# Patient Record
Sex: Female | Born: 1990 | Race: White | Hispanic: No | Marital: Single | Smoking: Never smoker
Health system: Southern US, Community
[De-identification: ages and names within clinical notes are randomized; demographics above are authoritative.]

## PROBLEM LIST (undated history)

## (undated) HISTORY — PX: NO PAST SURGERIES: SHX2092

---

## 2011-10-16 ENCOUNTER — Emergency Department: Payer: Self-pay | Admitting: Emergency Medicine

## 2019-07-06 ENCOUNTER — Other Ambulatory Visit: Payer: Self-pay

## 2019-07-06 ENCOUNTER — Ambulatory Visit (INDEPENDENT_AMBULATORY_CARE_PROVIDER_SITE_OTHER): Payer: 59

## 2019-07-06 ENCOUNTER — Ambulatory Visit
Admission: EM | Admit: 2019-07-06 | Discharge: 2019-07-06 | Disposition: A | Discharge status: Home / Self Care | Payer: 59 | Attending: Family Medicine | Admitting: Family Medicine

## 2019-07-06 DIAGNOSIS — M25571 Pain in right ankle and joints of right foot: Secondary | ICD-10-CM | POA: Diagnosis not present

## 2019-07-06 DIAGNOSIS — W108XXA Fall (on) (from) other stairs and steps, initial encounter: Secondary | ICD-10-CM

## 2019-07-06 DIAGNOSIS — S93401A Sprain of unspecified ligament of right ankle, initial encounter: Secondary | ICD-10-CM | POA: Diagnosis not present

## 2019-07-06 NOTE — Discharge Instructions (Addendum)
Rest, ice, elevation, over the counter tylenol/ibuprofen as needed

## 2019-07-06 NOTE — ED Provider Notes (Signed)
[facility]    CSN: [id number] Arrival date & time: 07/06/19  1621      History   Chief Complaint Chief Complaint  Patient presents with  . Ankle Pain    right    HPI Ann Jones is a 28 y.o. female.   28 yo female with a c/o right ankle pain and swelling after falling down some steps last night and twisting her foot. States that she "heard a crack". Has been able to bear weight but painful.    Ankle Pain   History reviewed. No pertinent past medical history.  There are no active problems to display for this patient.   Past Surgical History:  Procedure Laterality Date  . NO PAST SURGERIES      OB History   No obstetric history on file.      Home Medications    Prior to Admission medications   Medication Sig Start Date End Date Taking? Authorizing Provider  sertraline (ZOLOFT) 50 MG tablet Take 50 mg by mouth daily. 05/28/19   [provider]  SPRINTEC 28 0.25-35 MG-MCG tablet Take 1 tablet by mouth daily. 05/28/19   [provider]    Family History Family History  Problem Relation Age of Onset  . Heart disease Mother     Social History Social History   Tobacco Use  . Smoking status: Never Smoker  . Smokeless tobacco: Never Used  Substance Use Topics  . Alcohol use: Yes    Frequency: Never    Comment: occasionally  . Drug use: Never     Allergies   Patient has no known allergies.   Review of Systems Review of Systems   Physical Exam Triage Vital Signs ED Triage Vitals  Enc Vitals Group     BP 07/06/19 1646 112/85     Pulse Rate 07/06/19 1646 70     Resp 07/06/19 1646 16     Temp 07/06/19 1646 98.9 F (37.2 C)     Temp Source 07/06/19 1646 Oral     SpO2 07/06/19 1646 100 %     Weight 07/06/19 1641 130 lb (59 kg)     Height 07/06/19 1641 5\' 2"  (1.575 m)     Head Circumference --      Peak Flow --      Pain Score 07/06/19 1641 3     Pain Loc --      Pain Edu? --      Excl. in GC? --    No  data found.  Updated Vital Signs BP 112/85 (BP Location: Left Arm)   Pulse 70   Temp 98.9 F (37.2 C) (Oral)   Resp 16   Ht 5\' 2"  (1.575 m)   Wt 59 kg   LMP 06/22/2019   SpO2 100%   BMI 23.78 kg/m   Visual Acuity Right Eye Distance:   Left Eye Distance:   Bilateral Distance:    Right Eye Near:   Left Eye Near:    Bilateral Near:     Physical Exam Vitals signs and nursing note reviewed.  Constitutional:      General: She is not in acute distress.    Appearance: She is not toxic-appearing or diaphoretic.  Musculoskeletal:     Right ankle: She exhibits decreased range of motion and swelling. She exhibits no ecchymosis and normal pulse. Tenderness. Lateral malleolus and AITFL tenderness found. No medial malleolus, no CF ligament, no posterior TFL, no head of 5th metatarsal and no  proximal fibula tenderness found. Achilles tendon normal.     Comments: Right foot neurovascularly intact  Neurological:     Mental Status: She is alert.      UC Treatments / Results  Labs (all labs ordered are listed, but only abnormal results are displayed) Labs Reviewed - No data to display  EKG   Radiology Dg Ankle Complete Right  Result Date: 07/06/2019 CLINICAL DATA:  Status post fall. EXAM: RIGHT ANKLE - COMPLETE 3+ VIEW COMPARISON:  None. FINDINGS: There is soft tissue swelling about the ankle, most notably about the lateral malleolus. There is no acute displaced fracture or dislocation. The mortise joint is well preserved. IMPRESSION: Negative. Electronically Signed   By: Constance Holster M.D.   On: 07/06/2019 17:14    Procedures Procedures (including critical care time)  Medications Ordered in UC Medications - No data to display  Initial Impression / Assessment and Plan / UC Course  I have reviewed the triage vital signs and the nursing notes.  Pertinent labs & imaging results that were available during my care of the patient were reviewed by me and considered in my  medical decision making (see chart for details).      Final Clinical Impressions(s) / UC Diagnoses   Final diagnoses:  Sprain of right ankle, unspecified ligament, initial encounter     Discharge Instructions     Rest, ice, elevation, over the counter tylenol/ibuprofen as needed    ED Prescriptions    None      1. x-ray results and diagnosis reviewed with patient 2. Ankle brace applied 3. Recommend supportive treatment as above  4. Follow-up prn if symptoms worsen or don't improve  PDMP not reviewed this encounter.   Norval Gable, MD 07/06/19 1739

## 2019-07-06 NOTE — ED Triage Notes (Signed)
Patient complains of a fall down her steps last night. Patient with pain and swelling on ankle. Reports that she did hear a crack when she fell.

## 2020-09-25 IMAGING — CR DG ANKLE COMPLETE 3+V*R*
3 series · 3 of 3 positions shown · non-contrast
Comparison: None.

CLINICAL DATA: Status post fall.

EXAM:
RIGHT ANKLE - COMPLETE 3+ VIEW

[ankle ap]
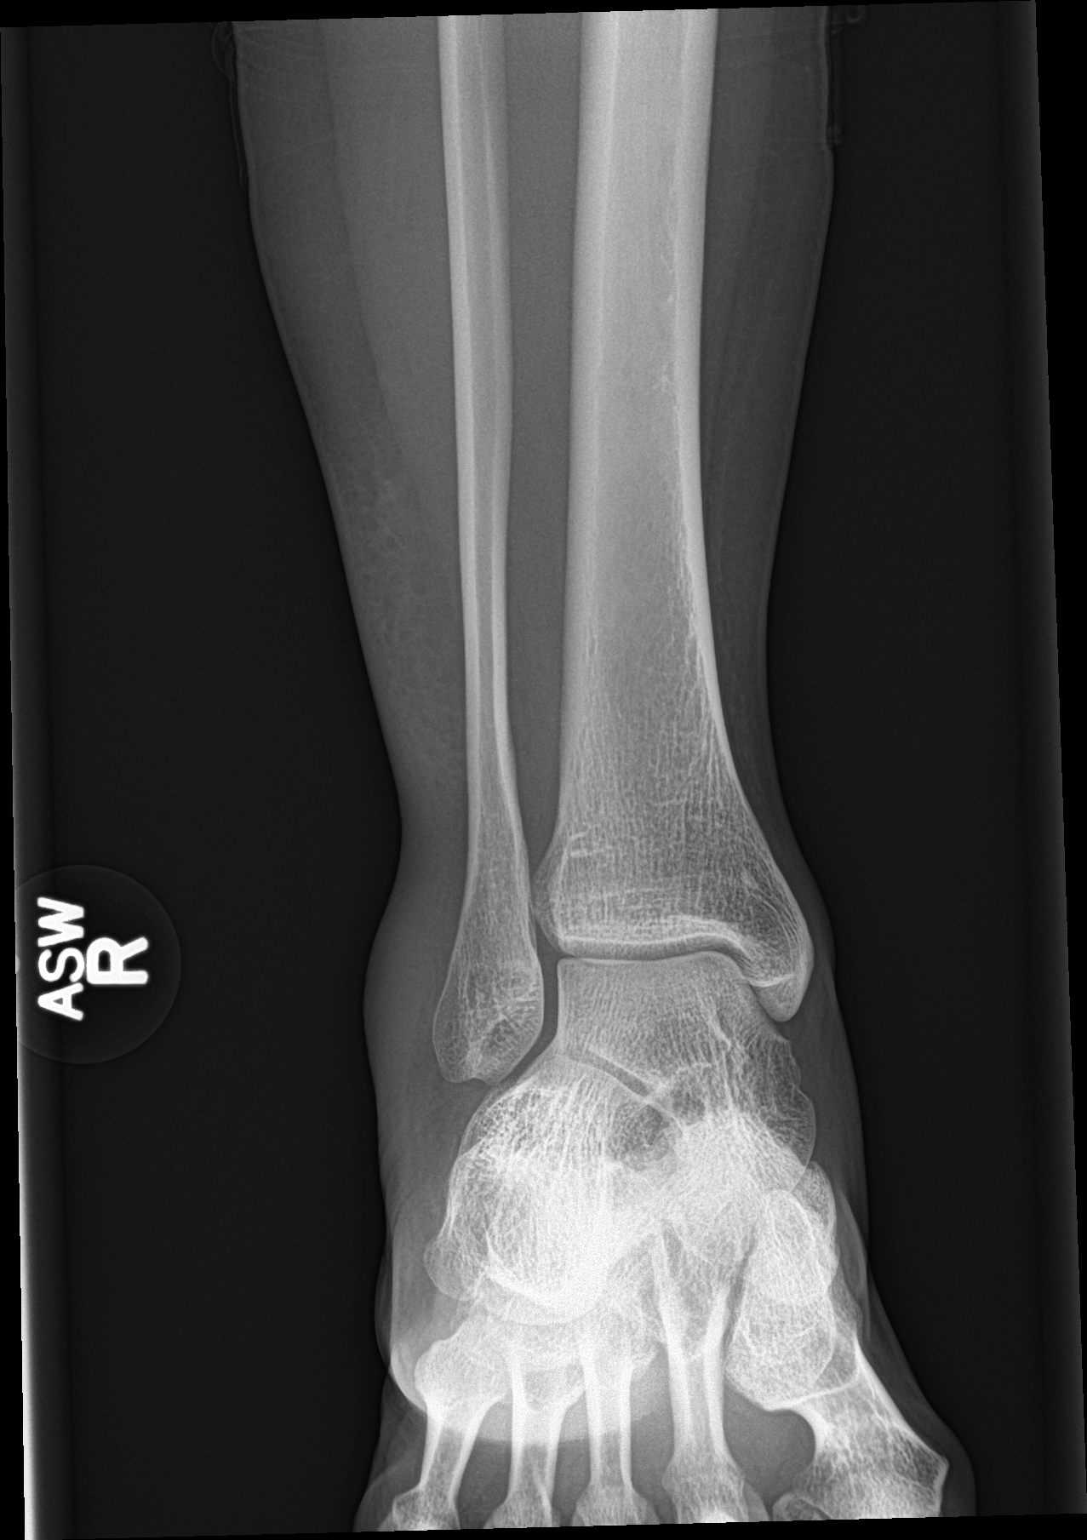

[ankle obl]
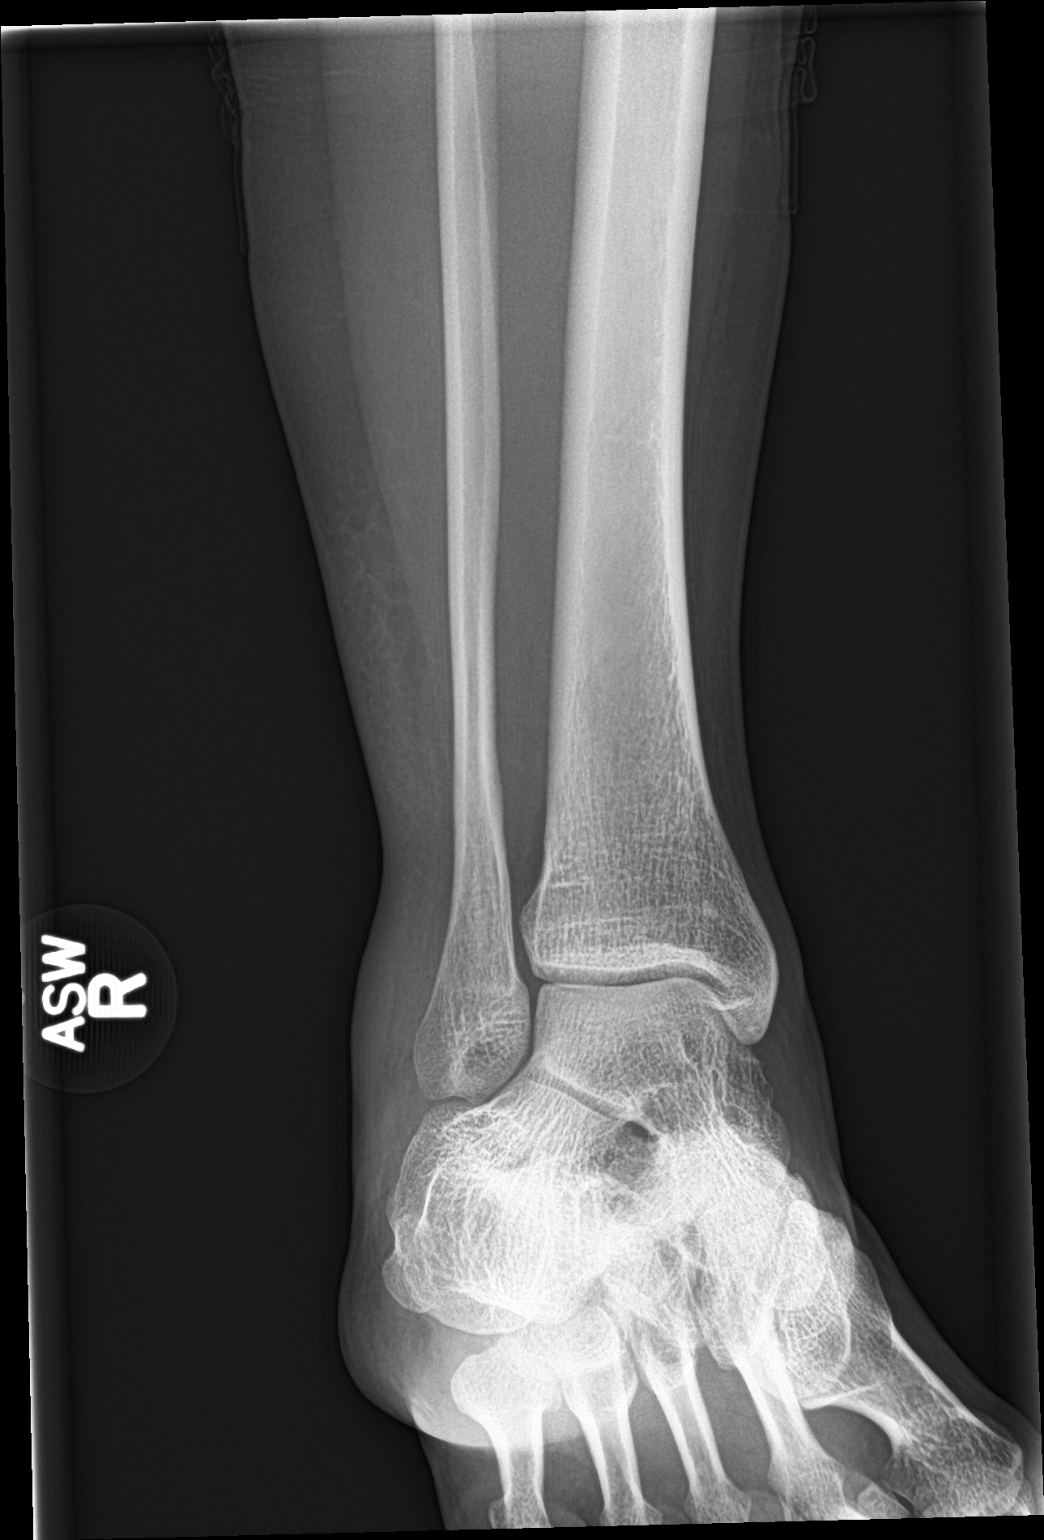

[ankle lat]
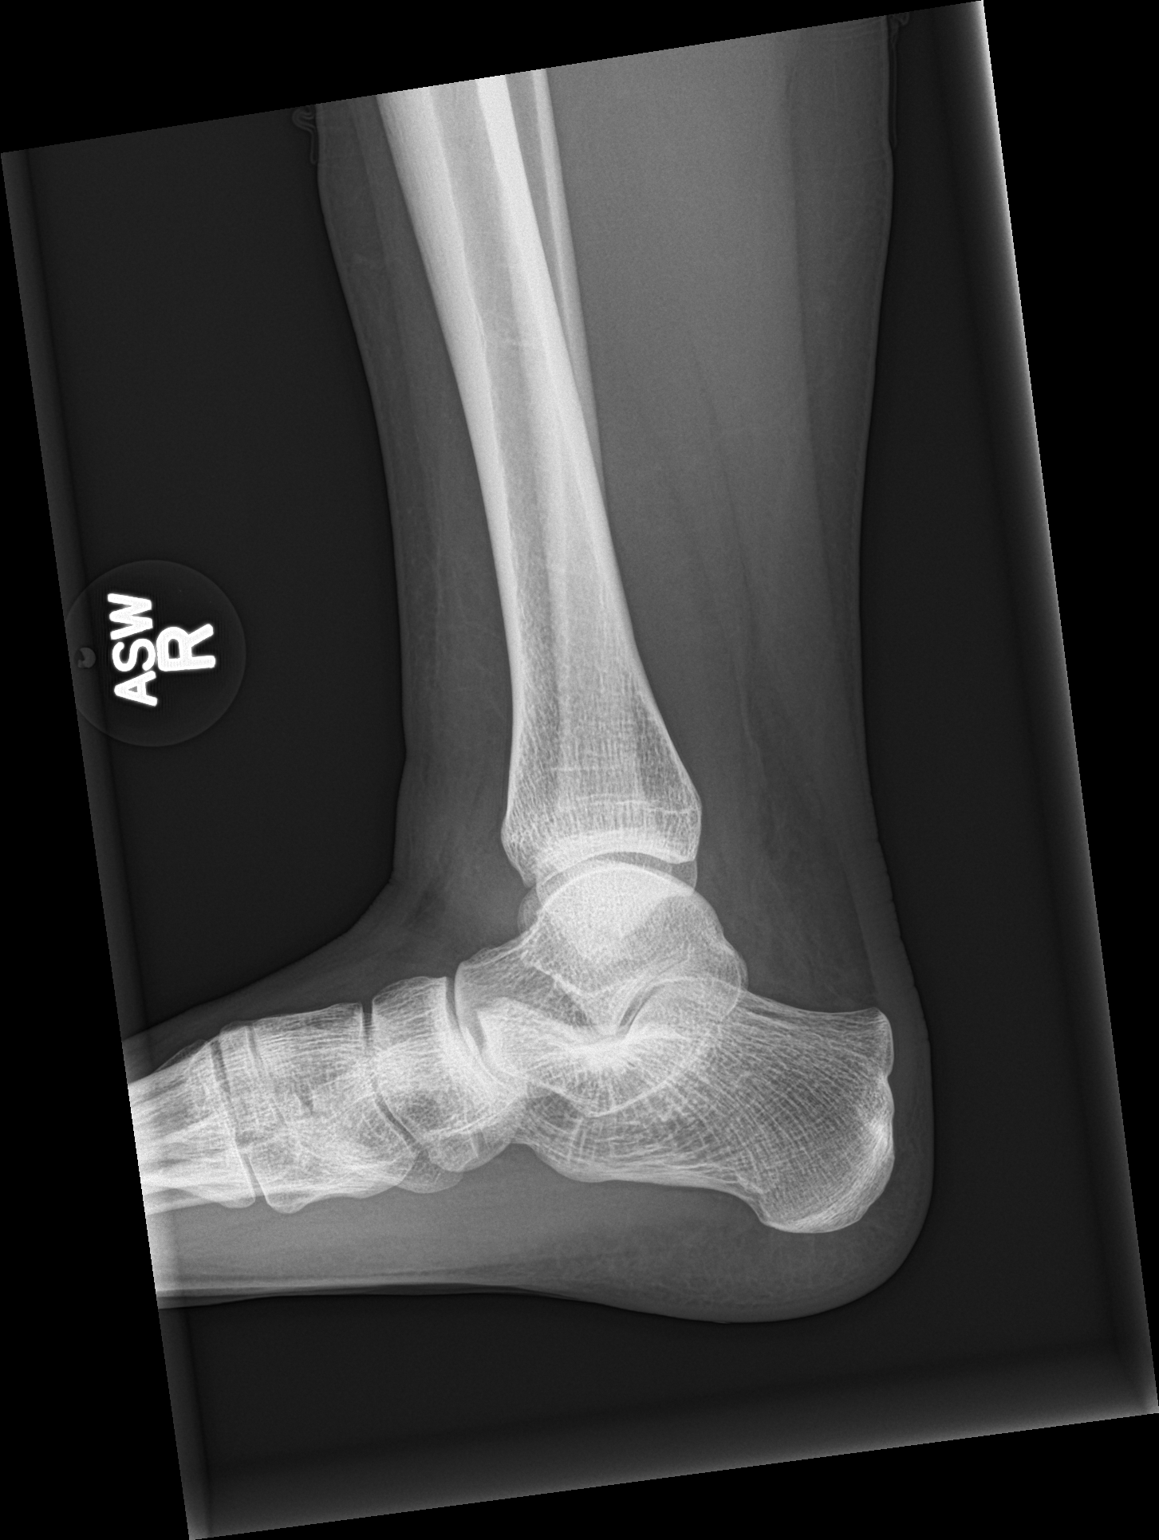

[3 of 3 positions shown; findings below may reference images not displayed]

FINDINGS: There is soft tissue swelling about the ankle, most notably about
the lateral malleolus. There is no acute displaced fracture or
dislocation. The mortise joint is well preserved.
IMPRESSION: Negative.
